# Patient Record
Sex: Female | Born: 1980 | Race: Black or African American | Hispanic: No | Marital: Single | Smoking: Never smoker
Health system: Southern US, Community
[De-identification: ages and names within clinical notes are randomized; demographics above are authoritative.]

## PROBLEM LIST (undated history)

## (undated) DIAGNOSIS — R87629 Unspecified abnormal cytological findings in specimens from vagina: Secondary | ICD-10-CM

## (undated) DIAGNOSIS — I10 Essential (primary) hypertension: Secondary | ICD-10-CM

## (undated) HISTORY — DX: Unspecified abnormal cytological findings in specimens from vagina: R87.629

## (undated) HISTORY — DX: Essential (primary) hypertension: I10

---

## 2015-06-06 ENCOUNTER — Encounter: Payer: Medicaid Other | Admitting: Family Medicine

## 2015-06-13 ENCOUNTER — Ambulatory Visit (INDEPENDENT_AMBULATORY_CARE_PROVIDER_SITE_OTHER): Payer: Medicaid Other | Admitting: Family Medicine

## 2015-06-13 ENCOUNTER — Other Ambulatory Visit (HOSPITAL_COMMUNITY)
Admission: RE | Admit: 2015-06-13 | Discharge: 2015-06-13 | Disposition: A | Discharge status: Home / Self Care | Payer: Medicaid Other | Source: Ambulatory Visit | Attending: Family Medicine | Admitting: Family Medicine

## 2015-06-13 ENCOUNTER — Encounter: Payer: Self-pay | Admitting: Family Medicine

## 2015-06-13 VITALS — Ht 67.0 in | Wt 142.8 lb

## 2015-06-13 DIAGNOSIS — R87612 Low grade squamous intraepithelial lesion on cytologic smear of cervix (LGSIL): Secondary | ICD-10-CM | POA: Diagnosis present

## 2015-06-13 DIAGNOSIS — R87619 Unspecified abnormal cytological findings in specimens from cervix uteri: Secondary | ICD-10-CM | POA: Diagnosis present

## 2015-06-13 LAB — POCT PREGNANCY, URINE: PREG TEST UR: NEGATIVE

## 2015-06-13 NOTE — Progress Notes (Signed)
PAP: LSIL Patient given informed consent, signed copy in the chart, time out was performed.  Placed in lithotomy position. Cervix viewed with speculum and colposcope after application of acetic acid.   Colposcopy adequate?  Yes, TMZ seen 360 Acetowhite lesions?12 oclock, 5 oclock Punctation?course at 5 oclock Mosaicism?  no Abnormal vasculature?  no Biopsies?5, 12 oclock ECC?yes  Patient was given post procedure instructions.

## 2015-06-13 NOTE — Patient Instructions (Signed)

## 2015-06-24 ENCOUNTER — Telehealth: Payer: Self-pay | Admitting: General Practice

## 2015-06-24 NOTE — Telephone Encounter (Signed)
Telephone call to patient regarding colpo results, need for LEEP, and LEEP appt. No answer- left message stating we are trying to reach you with results and in regards to an appt, please call us back at the clinics

## 2015-06-24 NOTE — Telephone Encounter (Signed)
Received message left on nurse line today at 1321.  Patient states she is returning our call.  Needs appointment and test results.  Requests a return call.

## 2015-06-27 NOTE — Telephone Encounter (Addendum)
Attempted to contact pt LM to return call back to the Clinics and that we will be closed for the holiday and reopening on Tuesday.   9/8  1650  Called pt and informed her of Colpo results requiring LEEP procedure. The procedure was briefly explained.  Pt voiced understanding and agreed to LEEP appt on 9/30 @ 0930.  Diane Day RNC

## 2015-07-25 ENCOUNTER — Ambulatory Visit (INDEPENDENT_AMBULATORY_CARE_PROVIDER_SITE_OTHER): Payer: Medicaid Other | Admitting: Family Medicine

## 2015-07-25 ENCOUNTER — Encounter: Payer: Self-pay | Admitting: Family Medicine

## 2015-07-25 ENCOUNTER — Other Ambulatory Visit (HOSPITAL_COMMUNITY)
Admission: RE | Admit: 2015-07-25 | Discharge: 2015-07-25 | Disposition: A | Discharge status: Home / Self Care | Payer: Medicaid Other | Source: Ambulatory Visit | Attending: Family Medicine | Admitting: Family Medicine

## 2015-07-25 VITALS — BP 129/98 | HR 68 | Temp 98.7°F | Wt 145.8 lb

## 2015-07-25 DIAGNOSIS — Z3202 Encounter for pregnancy test, result negative: Secondary | ICD-10-CM | POA: Diagnosis not present

## 2015-07-25 DIAGNOSIS — R87619 Unspecified abnormal cytological findings in specimens from cervix uteri: Secondary | ICD-10-CM | POA: Diagnosis not present

## 2015-07-25 DIAGNOSIS — N871 Moderate cervical dysplasia: Secondary | ICD-10-CM

## 2015-07-25 LAB — POCT PREGNANCY, URINE: PREG TEST UR: NEGATIVE

## 2015-07-25 NOTE — Patient Instructions (Signed)

## 2015-07-25 NOTE — Progress Notes (Signed)
  LEEP PROCEDURE NOTE Pap smear and colposcopy reviewed.   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  Role of HPV,cervical dysplasia and need for close followup was empasized. Informed written consent was obtained. All questions were answered. Time out performed.  Procedure: The patient was placed in lithotomy position and the bivalved coated speculum was placed in the patient's vagina. A grounding pad placed on the patient. Lugol's solution was applied to the cervix and areas of decreased uptake were noted 6, 12 o'clock.   Local anesthesia was administered via an intracervical block using 10cc of 2% Lidocaine with epinephrine. The suction was turned on and the Large 1X Fisher Cone Biopsy Excisor on 50 Watts of cutting current was used to excise the area of decreased uptake and excise the entire transformation zone. Excellent hemostasis was achieved using roller ball coagulation set at 50 Watts coagulation current. Monsel's solution was then applied and the speculum was removed from the vagina. Specimens were sent to pathology. The patient tolerated the procedure well. Post-operative instructions given to patient, including instruction to seek medical attention for persistent bright red bleeding, fever, abdominal/pelvic pain, dysuria, nausea or vomiting. She was also told about the possibility of having copious yellow to black tinged discharge. She was counseled to avoid anything in the vagina (sex/douching/tampons) for 4-6 weeks.

## 2015-08-01 ENCOUNTER — Telehealth: Payer: Self-pay | Admitting: *Deleted

## 2015-08-01 NOTE — Telephone Encounter (Signed)
Per Dr. Adrian Blackwater need to call Erminie and let her know LEEP specimen shows all abnormal tissue removed, recommend cotesting in 12 months.    Called [place] and notified her above- She voices understanding. She plans to get pap with cotesting at Doctor who referred her to [place].  [facility].

## 2015-08-14 ENCOUNTER — Telehealth: Payer: Self-pay

## 2015-08-14 NOTE — Telephone Encounter (Signed)
Pt called and stated that she has a LEEP on 07/25/15 and that now she has been bleeding for 3 weeks.  Is this normal?

## 2015-08-15 NOTE — Telephone Encounter (Signed)
Called April Willis and she reports after her LEEP she had yellowish discharge for about 3 days, then light bleeding for a few days, then heavier bleeding for a few days. Since then states it slowed down then came back, and got light again. States now feels like her period is coming on because it has been a month and is due- c/o cramping, bleeding with small clots like a period, changing pad every 2 hours.  Instructed it LEEP can cause change in cycle and if she has not stopped bleeding by Wednesday next week ( 08/20/15 ) to call us and request an appointment to be seen. If bleeding becomes heavy enough to saturate pad in one hour or less for several hours to go to [facility]. She voices understanding.

## 2020-07-18 ENCOUNTER — Ambulatory Visit
Admission: RE | Admit: 2020-07-18 | Discharge: 2020-07-18 | Disposition: A | Discharge status: Home / Self Care | Payer: Medicaid Other | Source: Ambulatory Visit | Attending: Family | Admitting: Family

## 2020-07-18 ENCOUNTER — Other Ambulatory Visit: Payer: Self-pay | Admitting: Family

## 2020-07-18 ENCOUNTER — Other Ambulatory Visit: Payer: Self-pay

## 2020-07-18 DIAGNOSIS — M25562 Pain in left knee: Secondary | ICD-10-CM

## 2020-08-28 ENCOUNTER — Encounter: Payer: Medicaid Other | Attending: Family | Admitting: Dietician

## 2020-08-28 ENCOUNTER — Other Ambulatory Visit: Payer: Self-pay

## 2020-08-28 ENCOUNTER — Encounter: Payer: Self-pay | Admitting: Dietician

## 2020-08-28 DIAGNOSIS — I10 Essential (primary) hypertension: Secondary | ICD-10-CM | POA: Insufficient documentation

## 2020-08-28 NOTE — Progress Notes (Signed)
Medical Nutrition Therapy   Primary concerns today: high blood pressure  Referral diagnosis: I10- hypertension  Preferred learning style: no preference indicated Learning readiness: ready   NUTRITION ASSESSMENT   Clinical Medical Hx: HTN Medications: see chart  Lifestyle & Dietary Hx Patient works at [employer] May eat out sometimes, such as [employer] salad, chicken sandwich, [employer] tuna sandwich. At home will cook with "complete seasoning" and likes to make dishes such as spaghetti made with ground [place]. May snack on Doritos, Cheetos, tuna with crackers, bananas, and cookies. Usually skips breakfast, typical meal pattern is 2-3 meals per day plus snacks.   24-Hr Dietary Recall First Meal: bacon egg & cheese biscuit (or scrambled eggs) Snack: Starkist tuna + wheat crackers Second Meal: sandwich on wheat bread with [place], swiss cheese, and honey mustard Snack: oatmeal raisin cookies Third Meal: beef with rice Snack: - Beverages: green tea w/ sweetener & creamer, water   NUTRITION DIAGNOSIS  Food and nutrition related knowledge deficit (NB-1.1) related to no prior nutrition education as evidenced by referral for hypertension.    NUTRITION INTERVENTION  Nutrition education (E-1) on the following topics:  . Hypertension and nutritional strategies for helping to lower blood pressure  Handouts Provided Include   Hypertension Nutrition Therapy   Learning Style & Readiness for Change Teaching method utilized: Visual & Auditory  Demonstrated degree of understanding via: Teach Back  Barriers to learning/adherence to lifestyle change: None Identified    MONITORING & EVALUATION Dietary intake, weekly physical activity, and goals PRN.  Next Steps  Patient is to contact [facility] as needed.

## 2020-11-01 ENCOUNTER — Other Ambulatory Visit: Payer: Self-pay

## 2020-11-01 ENCOUNTER — Other Ambulatory Visit: Payer: Medicaid Other

## 2020-11-01 DIAGNOSIS — Z20822 Contact with and (suspected) exposure to covid-19: Secondary | ICD-10-CM

## 2020-11-03 LAB — SARS-COV-2, NAA 2 DAY TAT

## 2020-11-03 LAB — NOVEL CORONAVIRUS, NAA: SARS-CoV-2, NAA: DETECTED — AB

## 2020-11-04 ENCOUNTER — Ambulatory Visit: Payer: Self-pay | Admitting: *Deleted

## 2020-11-04 NOTE — Telephone Encounter (Signed)
Pt called in with questions regarding her positive covid test.   The quarantine instructions, etc were sent to her yesterday via MyChart from a nurse.   I answered her questions regarding the instructions. She thanked me for my help with clarification.    Reason for Disposition . [1] [id number] diagnosed by positive lab test (e.g., PCR, rapid self-test kit) AND [2] mild symptoms (e.g., cough, fever, others) AND [3] no complications or SOB  Answer Assessment - Initial Assessment Questions 1. COVID-19 DIAGNOSIS: "Who made your COVID-19 diagnosis?" "Was it confirmed by a positive lab test?" If not diagnosed by a HCP, ask "Are there lots of cases (community spread) where you live?" Note: See public health department website, if unsure.     Yes  2. COVID-19 EXPOSURE: "Was there any known exposure to COVID before the symptoms began?" CDC Definition of close contact: within 6 feet (2 meters) for a total of 15 minutes or more over a 24-hour period.      *No Answer* 3. ONSET: "When did the COVID-19 symptoms start?"      Last Wed. 4. WORST SYMPTOM: "What is your worst symptom?" (e.g., cough, fever, shortness of breath, muscle aches)     No 5. COUGH: "Do you have a cough?" If Yes, ask: "How bad is the cough?"       No 6. FEVER: "Do you have a fever?" If Yes, ask: "What is your temperature, how was it measured, and when did it start?"     *No Answer* 7. RESPIRATORY STATUS: "Describe your breathing?" (e.g., shortness of breath, wheezing, unable to speak)      *No Answer* 8. BETTER-SAME-WORSE: "Are you getting better, staying the same or getting worse compared to yesterday?"  If getting worse, ask, "In what way?"     *No Answer* 9. HIGH RISK DISEASE: "Do you have any chronic medical problems?" (e.g., asthma, heart or lung disease, weak immune system, obesity, etc.)     *No Answer* 10. VACCINE: "Have you gotten the COVID-19 vaccine?" If Yes ask: "Which one, how many shots, when did you get it?"       *No  Answer* 11. PREGNANCY: "Is there any chance you are pregnant?" "When was your last menstrual period?"       *No Answer* 12. OTHER SYMPTOMS: "Do you have any other symptoms?"  (e.g., chills, fatigue, headache, loss of smell or taste, muscle pain, sore throat; new loss of smell or taste especially support the diagnosis of COVID-19)       *No Answer*  Protocols used: CORONAVIRUS (COVID-19) DIAGNOSED OR SUSPECTED-A-AH

## 2022-04-04 ENCOUNTER — Ambulatory Visit
Admission: EM | Admit: 2022-04-04 | Discharge: 2022-04-04 | Disposition: A | Discharge status: Home / Self Care | Payer: Medicaid Other | Attending: Urgent Care | Admitting: Urgent Care

## 2022-04-04 ENCOUNTER — Ambulatory Visit (INDEPENDENT_AMBULATORY_CARE_PROVIDER_SITE_OTHER): Payer: Medicaid Other

## 2022-04-04 DIAGNOSIS — M62830 Muscle spasm of back: Secondary | ICD-10-CM | POA: Diagnosis not present

## 2022-04-04 DIAGNOSIS — M545 Low back pain, unspecified: Secondary | ICD-10-CM

## 2022-04-04 DIAGNOSIS — M4126 Other idiopathic scoliosis, lumbar region: Secondary | ICD-10-CM

## 2022-04-04 DIAGNOSIS — I10 Essential (primary) hypertension: Secondary | ICD-10-CM

## 2022-04-04 MED ORDER — TRIAMCINOLONE ACETONIDE 40 MG/ML IJ SUSP
40.0000 mg | Freq: Once | INTRAMUSCULAR | Status: AC
  Administered 2022-04-04: 40 mg via INTRAMUSCULAR

## 2022-04-04 MED ORDER — TIZANIDINE HCL 4 MG PO TABS
4.0000 mg | ORAL_TABLET | Freq: Every day | ORAL | 0 refills | Status: AC
Start: 2022-04-04 — End: ?

## 2022-04-04 NOTE — ED Triage Notes (Signed)
Pt states about a month ago at work she was lifting something heavy and is now having back pain (ache) that has progressively gotten worse. She states if she moves too fast it feels like her left knee is going to give up.  Home interventions: Aleve - hot helpful

## 2022-04-04 NOTE — Discharge Instructions (Addendum)
We have done a steroid injection today for your severe back pain. Stop using Aleve. You can use Tylenol at a dose of 500-650mg  every 6 hours as needed. Use tizanidine at bedtime. If your pain persists, seek a consultation with a spine specialist listed on the first page.   For diabetes or elevated blood sugar, please make sure you are limiting and avoiding starchy, carbohydrate foods like pasta, breads, sweet breads, pastry, rice, potatoes, desserts. These foods can elevate your blood sugar. Also, limit and avoid drinks that contain a lot of sugar such as sodas, sweet teas, fruit juices.  Drinking plain water will be much more helpful, try 64-80 ounces of water daily. This can definitely help you with your back pain as well. It is okay to flavor your water naturally by cutting cucumber, lemon, mint or lime, placing it in a picture with water and drinking it over a period of 24-48 hours as long as it remains refrigerated.  For elevated blood pressure, make sure you are monitoring salt in your diet.  Do not eat restaurant foods and limit processed foods at home. I highly recommend you prepare and cook your own foods at home.  Processed foods include things like frozen meals, pre-seasoned meats and dinners, deli meats, canned foods as these foods contain a high amount of sodium/salt.  Make sure you are paying attention to sodium labels on foods you buy at the grocery store. Buy your spices separately such as garlic powder, onion powder, cumin, cayenne, parsley flakes so that you can avoid seasonings that contain salt. However, salt-free seasonings are available and can be used, an example is Mrs. Dash and includes a lot of different mixtures that do not contain salt.  Lastly, when cooking using oils that are healthier for you is important. This includes olive oil, avocado oil, canola oil. We have discussed a lot of foods to avoid but below is a list of foods that can be very healthy to use in your diet whether it  is for diabetes, cholesterol, high blood pressure, or in general healthy eating.  Salads - kale, spinach, cabbage, spring mix, arugula Fruits - avocadoes, berries (blueberries, raspberries, blackberries), apples, oranges, pomegranate, grapefruit, kiwi Vegetables - asparagus, cauliflower, broccoli, green beans, brussel sprouts, bell peppers, beets; stay away from or limit starchy vegetables like potatoes, carrots, peas Other general foods - kidney beans, egg whites, almonds, walnuts, sunflower seeds, pumpkin seeds, fat free yogurt, almond milk, flax seeds, quinoa, oats  Meat - It is better to eat lean meats and limit your red meat including pork to once a week.  Wild caught fish, chicken breast are good options as they tend to be leaner sources of good protein. Still be mindful of the sodium labels for the meats you buy.  DO NOT EAT ANY FOODS ON THIS LIST THAT YOU ARE ALLERGIC TO. For more specific needs, I highly recommend consulting a dietician or nutritionist but this can definitely be a good starting point.

## 2022-04-04 NOTE — ED Provider Notes (Addendum)
[facility]   MRN: [id number] DOB: 30-Jan-1981  Subjective:   April Willis is a 41 y.o. female presenting for 1 month history of progressively worsening low back pain, stiffness.  Symptoms started when she tried to lift a heavy item at work.  States that she had to try multiple times and felt like she strained herself.  The back pain has only gotten worse.  She is used Aleve consistently but is not helping at all.  Regarding her blood pressure, did not take her medications today.  Has follow-up with her PCP in about a week.  No weakness, numbness or tingling, fall, trauma, changes to bowel or urinary habits, saddle paresthesia.  No current facility-administered medications for this encounter. No current outpatient medications on file.   No Known Allergies  Past Medical History:  Diagnosis Date   Hypertension    Vaginal Pap smear, abnormal      History reviewed. No pertinent surgical history.  Family History  Problem Relation Age of Onset   Hypertension Father    Hypertension Mother    Ovarian cancer Mother     Social History   Tobacco Use   Smoking status: Never   Smokeless tobacco: Never  Substance Use Topics   Alcohol use: Yes   Drug use: No    ROS   Objective:   Vitals: BP (!) 144/102 (BP Location: Right Arm) Comment: pt states she has not taken her BP med today, provider aware  Pulse 82   Temp 98.7 F (37.1 C) (Oral)   Resp 18   LMP 03/15/2022   SpO2 97%   BP Readings from Last 3 Encounters:  04/04/22 (!) 144/102  07/25/15 (!) 129/98   Physical Exam Constitutional:      General: She is not in acute distress.    Appearance: Normal appearance. She is well-developed. She is not ill-appearing, toxic-appearing or diaphoretic.  HENT:     Head: Normocephalic and atraumatic.     Nose: Nose normal.     Mouth/Throat:     Mouth: Mucous membranes are moist.  Eyes:     General: No scleral icterus.       Right eye: No discharge.         Left eye: No discharge.     Extraocular Movements: Extraocular movements intact.  Cardiovascular:     Rate and Rhythm: Normal rate.  Pulmonary:     Effort: Pulmonary effort is normal.  Musculoskeletal:     Lumbar back: Spasms and tenderness (lower midline) present. No swelling, edema, deformity, signs of trauma, lacerations or bony tenderness. Normal range of motion. Negative right straight leg raise test and negative left straight leg raise test. No scoliosis.     Comments: Full range of motion throughout.  Strength 5/5 for lower extremities.  Patient ambulates without any assistance at expected pace.  No ecchymosis, swelling, lacerations or abrasions.    Skin:    General: Skin is warm and dry.  Neurological:     General: No focal deficit present.     Mental Status: She is alert and oriented to person, place, and time.  Psychiatric:        Mood and Affect: Mood normal.        Behavior: Behavior normal.    DG Lumbar Spine Complete  Result Date: 04/04/2022 CLINICAL DATA:  Low back pain EXAM: LUMBAR SPINE - COMPLETE 4+ VIEW COMPARISON:  None Available. FINDINGS: Mild curvature of the lumbar spine is convex towards the  left. Alignment is otherwise unremarkable. The vertebral body heights and disc spaces are well preserved. No significant degenerative change identified. No signs of acute fracture or dislocation. IMPRESSION: 1. No acute findings. 2. Mild scoliosis. Electronically Signed   By: Signa Kell M.D.   On: 04/04/2022 09:07    IM triamcinolone acetamide at a dose of 40 mg was injected.  Assessment and Plan :   PDMP not reviewed this encounter.  1. Acute low back pain, unspecified back pain laterality, unspecified whether sciatica present   2. Essential hypertension   3. Spasm of muscle of lower back    Given the severity of her pain, physical exam findings recommended steroids and the patient opted for an injection.  Use a muscle relaxant, hydrate well.  Emphasized need for  compliance with her hypertensive medications.  Follow-up with the neurosurgery clinic if symptoms persist. Counseled patient on potential for adverse effects with medications prescribed/recommended today, ER and return-to-clinic precautions discussed, patient verbalized understanding.     Wallis Bamberg, [place] 04/04/22 [phone]

## 2023-11-17 IMAGING — DX DG LUMBAR SPINE COMPLETE 4+V
5 series · 5 of 5 positions shown · non-contrast
Comparison: None Available.

CLINICAL DATA: Low back pain

EXAM:
LUMBAR SPINE - COMPLETE 4+ VIEW

[lumbar spine ap]
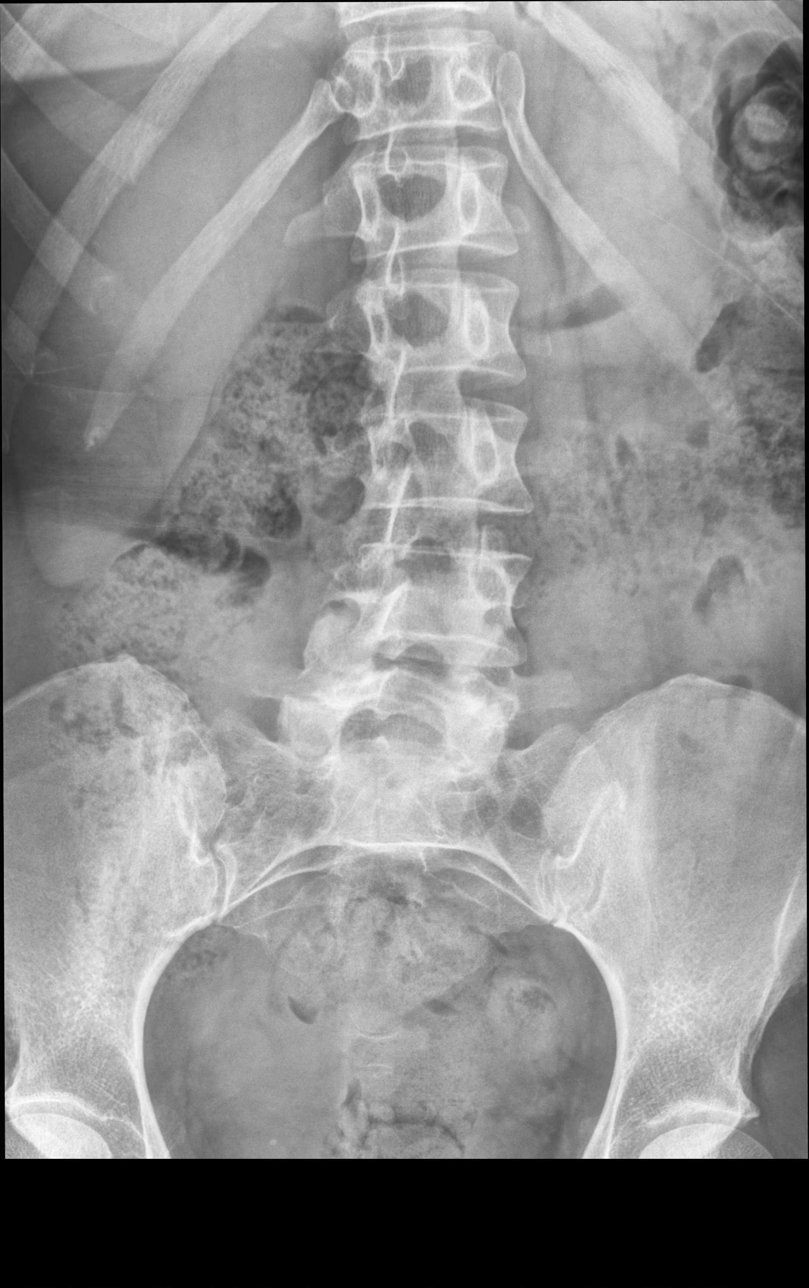

[lumbar spine rpo]
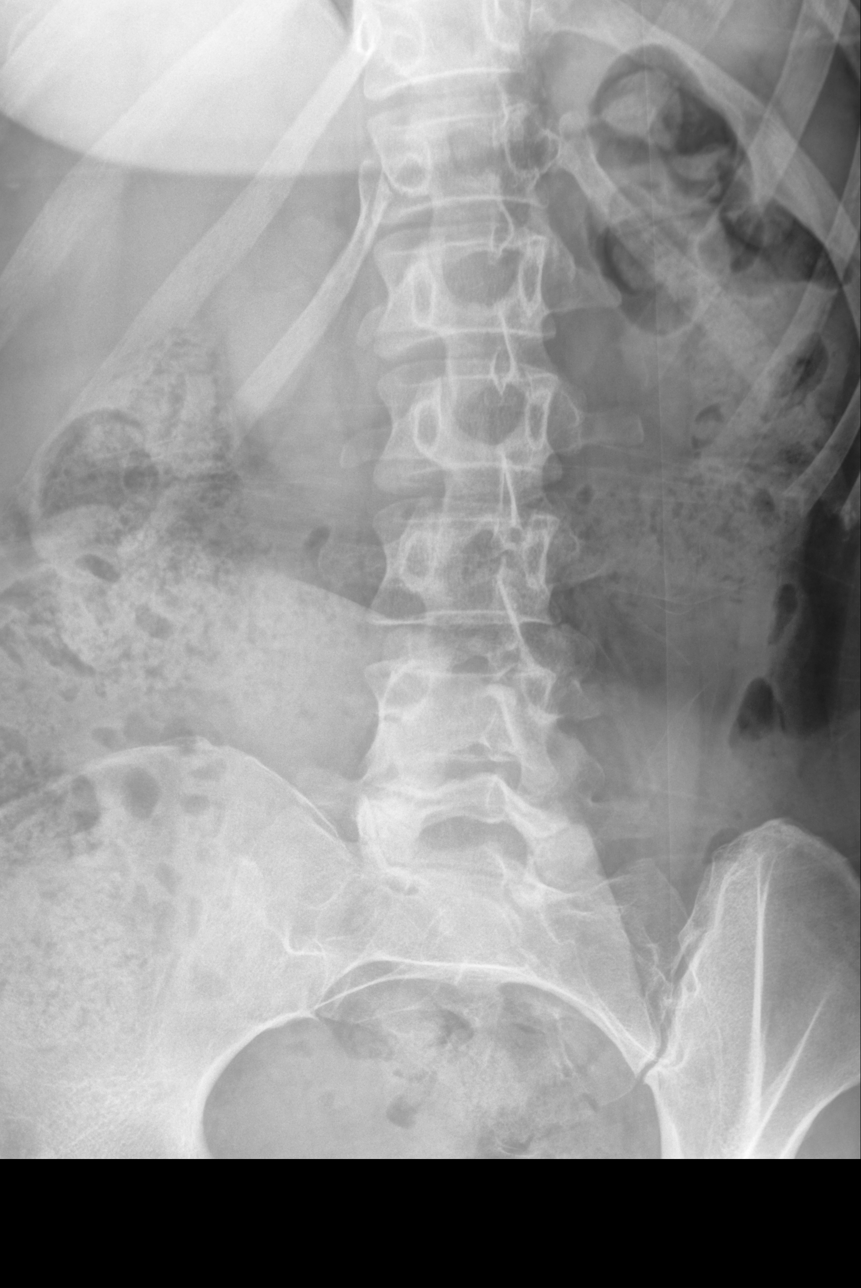

[lumbar spine lpo]
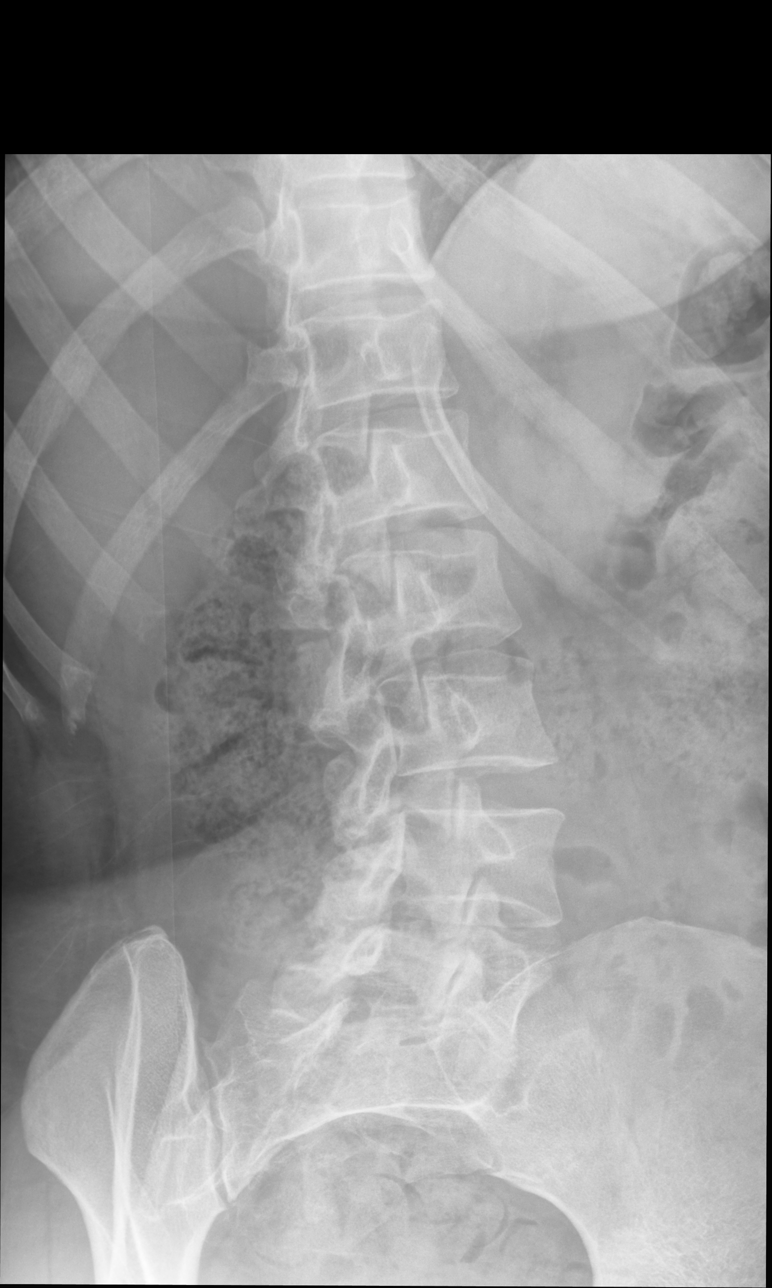

[lumbar spine lat]
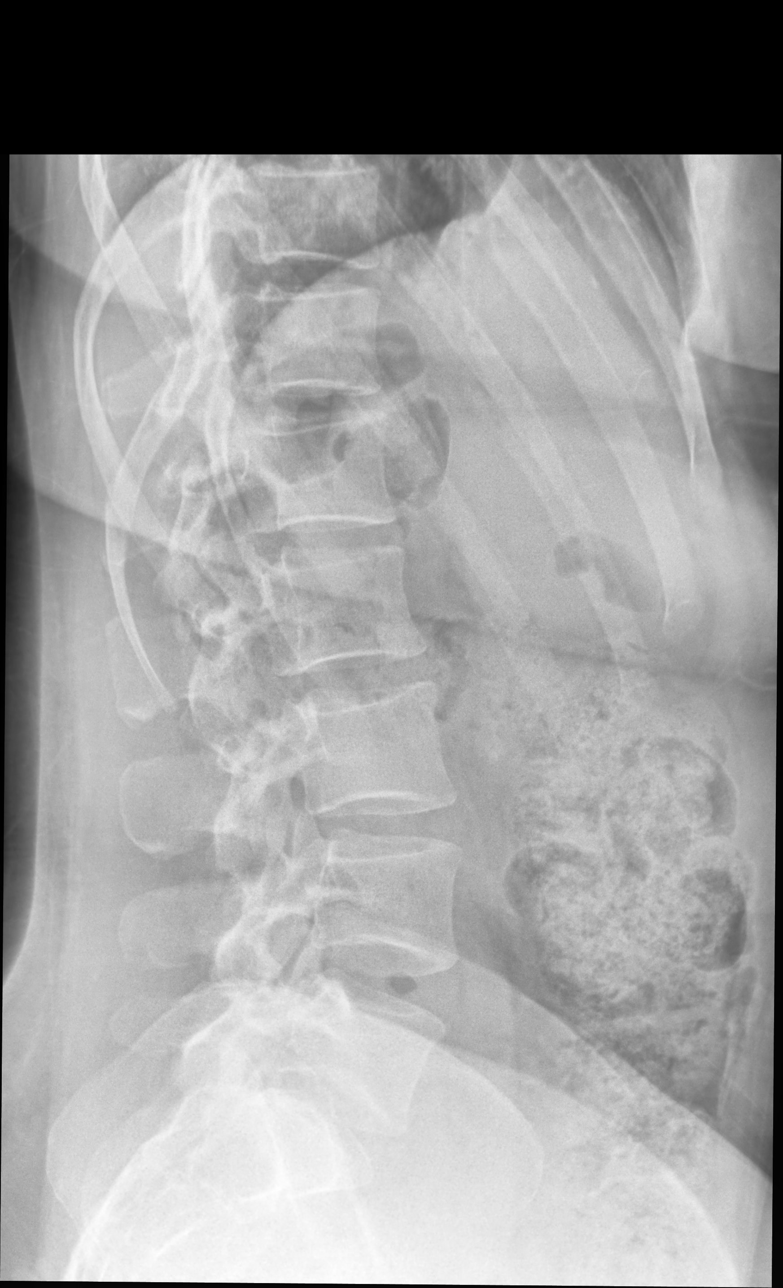

[lumbar spine l5-s1]
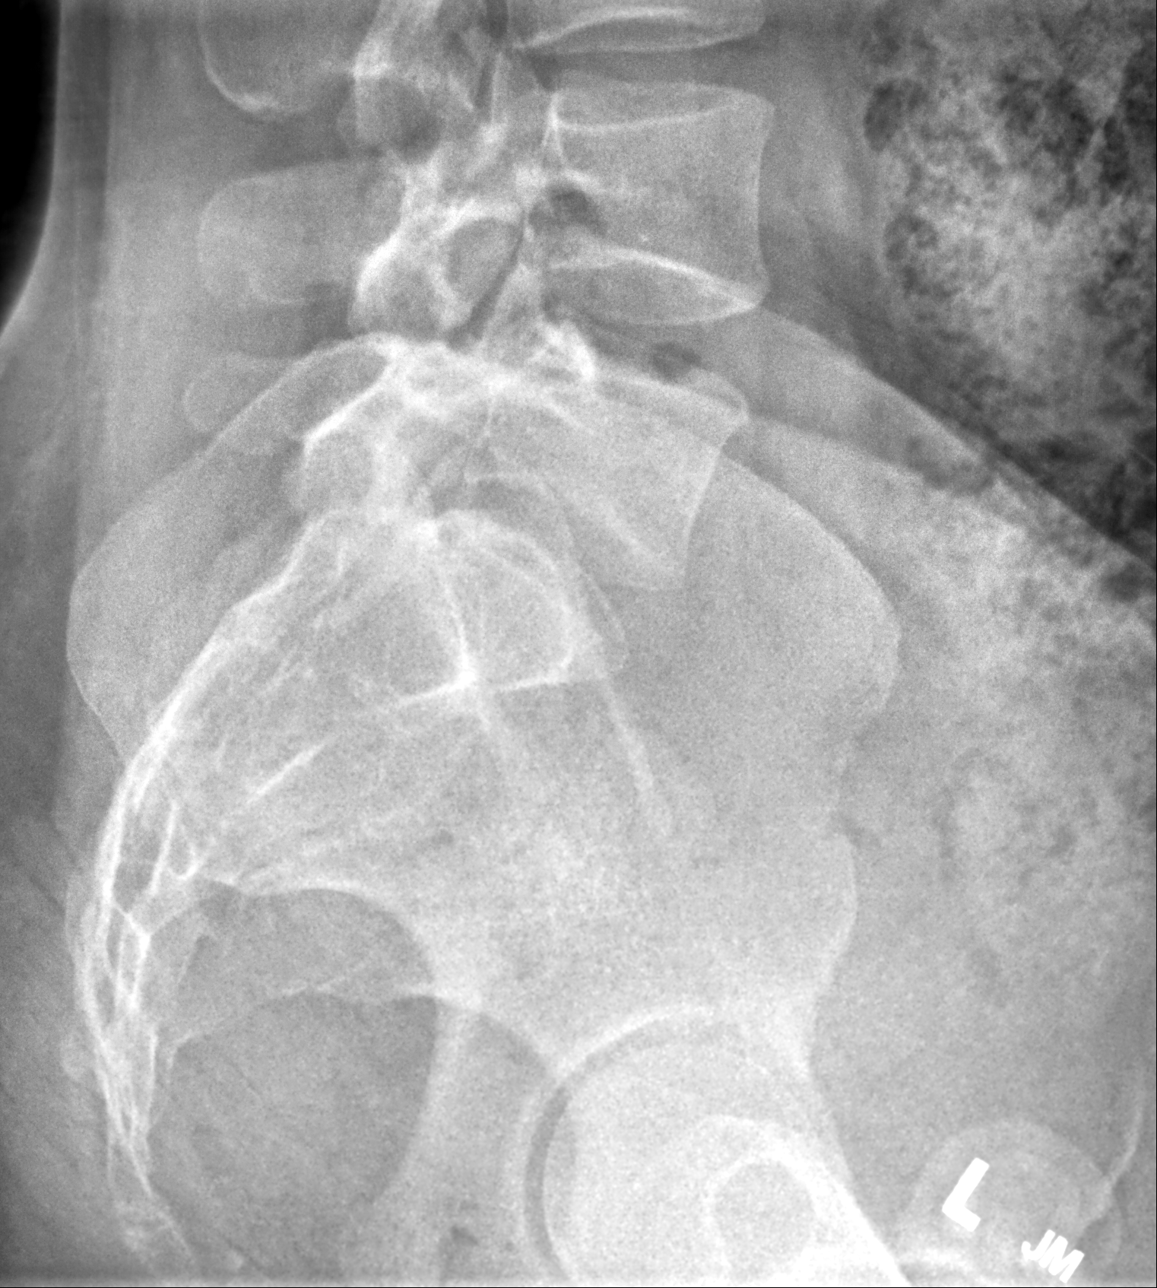

[5 of 5 positions shown; findings below may reference images not displayed]

FINDINGS: Mild curvature of the lumbar spine is convex towards the left.
Alignment is otherwise unremarkable. The vertebral body heights and
disc spaces are well preserved. No significant degenerative change
identified. No signs of acute fracture or dislocation.
IMPRESSION: 1. No acute findings.
2. Mild scoliosis.

## 2024-08-02 ENCOUNTER — Other Ambulatory Visit: Payer: Self-pay

## 2024-08-02 ENCOUNTER — Emergency Department (HOSPITAL_COMMUNITY)
Admission: EM | Admit: 2024-08-02 | Discharge: 2024-08-03 | Disposition: A | Discharge status: Home / Self Care | Attending: Emergency Medicine | Admitting: Emergency Medicine

## 2024-08-02 DIAGNOSIS — K529 Noninfective gastroenteritis and colitis, unspecified: Secondary | ICD-10-CM | POA: Insufficient documentation

## 2024-08-02 DIAGNOSIS — R1013 Epigastric pain: Secondary | ICD-10-CM | POA: Diagnosis present

## 2024-08-02 DIAGNOSIS — D72829 Elevated white blood cell count, unspecified: Secondary | ICD-10-CM | POA: Insufficient documentation

## 2024-08-02 LAB — COMPREHENSIVE METABOLIC PANEL WITH GFR
ALT: 6 U/L (ref 0–44)
AST: 13 U/L — ABNORMAL LOW (ref 15–41)
Albumin: 4.2 g/dL (ref 3.5–5.0)
Alkaline Phosphatase: 72 U/L (ref 38–126)
Anion gap: 11 (ref 5–15)
BUN: 9 mg/dL (ref 6–20)
CO2: 24 mmol/L (ref 22–32)
Calcium: 9.1 mg/dL (ref 8.9–10.3)
Chloride: 104 mmol/L (ref 98–111)
Creatinine, Ser: 0.78 mg/dL (ref 0.44–1.00)
GFR, Estimated: >60 mL/min (ref >60)
Glucose, Bld: 98 mg/dL (ref 70–99)
Potassium: 3.4 mmol/L — ABNORMAL LOW (ref 3.5–5.1)
Sodium: 138 mmol/L (ref 135–145)
Total Bilirubin: 0.8 mg/dL (ref 0.0–1.2)
Total Protein: 6.9 g/dL (ref 6.5–8.1)

## 2024-08-02 LAB — URINALYSIS, ROUTINE W REFLEX MICROSCOPIC
Bilirubin Urine: NEGATIVE
Glucose, UA: NEGATIVE mg/dL
Ketones, ur: NEGATIVE mg/dL
Nitrite: NEGATIVE
Protein, ur: 100 mg/dL — AB
RBC / HPF: >50 RBC/hpf (ref 0–5)
Specific Gravity, Urine: 1.017 (ref 1.005–1.030)
pH: 6 (ref 5.0–8.0)

## 2024-08-02 LAB — CBC
HCT: 37.6 % (ref 36.0–46.0)
Hemoglobin: 11.2 g/dL — ABNORMAL LOW (ref 12.0–15.0)
MCH: 25.4 pg — ABNORMAL LOW (ref 26.0–34.0)
MCHC: 29.8 g/dL — ABNORMAL LOW (ref 30.0–36.0)
MCV: 85.3 fL (ref 80.0–100.0)
Platelets: 446 K/uL — ABNORMAL HIGH (ref 150–400)
RBC: 4.41 MIL/uL (ref 3.87–5.11)
RDW: 13.5 % (ref 11.5–15.5)
WBC: 12.4 K/uL — ABNORMAL HIGH (ref 4.0–10.5)
nRBC: 0 % (ref 0.0–0.2)

## 2024-08-02 LAB — HCG, SERUM, QUALITATIVE: Preg, Serum: NEGATIVE

## 2024-08-02 LAB — LIPASE, BLOOD: Lipase: 19 U/L (ref 11–51)

## 2024-08-02 MED ORDER — ALUM & MAG HYDROXIDE-SIMETH 200-200-20 MG/5ML PO SUSP
30.0000 mL | Freq: Once | ORAL | Status: AC
  Administered 2024-08-02: 30 mL via ORAL
  Filled 2024-08-02: qty 30

## 2024-08-02 NOTE — ED Triage Notes (Signed)
 Patient c/o epigastric pain x 1 day. Patient report worsening pain after eating crackers tonight. Patient stated she tried TUMS with no relief. Patient report nausea and vomiting x 2. Patient denies chest pain and SOB. Patient denies dysuria.

## 2024-08-03 ENCOUNTER — Emergency Department (HOSPITAL_COMMUNITY)

## 2024-08-03 ENCOUNTER — Encounter (HOSPITAL_COMMUNITY): Payer: Self-pay

## 2024-08-03 MED ORDER — ONDANSETRON HCL 4 MG/2ML IJ SOLN
4.0000 mg | Freq: Once | INTRAMUSCULAR | Status: AC
  Administered 2024-08-03: 4 mg via INTRAVENOUS
  Filled 2024-08-03: qty 2

## 2024-08-03 MED ORDER — IOHEXOL 300 MG/ML  SOLN
100.0000 mL | Freq: Once | INTRAMUSCULAR | Status: AC | PRN
  Administered 2024-08-03: 100 mL via INTRAVENOUS

## 2024-08-03 MED ORDER — FAMOTIDINE 20 MG PO TABS: 20.0000 mg | ORAL_TABLET | Freq: Two times a day (BID) | ORAL | 0 refills | Status: AC

## 2024-08-03 MED ORDER — ONDANSETRON 8 MG PO TBDP: ORAL_TABLET | ORAL | 0 refills | Status: AC

## 2024-08-03 MED ORDER — KETOROLAC TROMETHAMINE 30 MG/ML IJ SOLN
30.0000 mg | Freq: Once | INTRAMUSCULAR | Status: AC
Start: 2024-08-03 — End: 2024-08-03
  Administered 2024-08-03: 30 mg via INTRAVENOUS
  Filled 2024-08-03: qty 1

## 2024-08-03 NOTE — ED Provider Notes (Signed)
 [place] EMERGENCY DEPARTMENT AT [facility] Provider Note   CSN: [id number] Arrival date & time: 08/02/24  1940     Patient presents with: Abdominal Pain   April Willis is a 43 y.o. female.   The history is provided by the patient.  Abdominal Pain Pain location:  Epigastric Pain quality: cramping   Pain radiates to:  Does not radiate Pain severity:  Severe Duration:  1 day Progression:  Waxing and waning Chronicity:  New Relieved by:  Nothing Worsened by:  Nothing Ineffective treatments:  None tried Associated symptoms: no anorexia and no fever   Risk factors: no alcohol abuse        Prior to Admission medications   Medication Sig Start Date End Date Taking? Authorizing Provider  famotidine (PEPCID) 20 MG tablet Take 1 tablet (20 mg total) by mouth 2 (two) times daily. 08/03/24  Yes Briggette Najarian, MD  ondansetron (ZOFRAN-ODT) 8 MG disintegrating tablet 8mg  ODT q8hours prn nausea 08/03/24  Yes Elgene Coral, MD  tiZANidine  (ZANAFLEX ) 4 MG tablet Take 1 tablet (4 mg total) by mouth at bedtime. 04/04/22   Christopher Savannah, PA-C    Allergies: Patient has no known allergies.    Review of Systems  Constitutional:  Negative for fever.  Respiratory:  Negative for wheezing and stridor.   Gastrointestinal:  Positive for abdominal pain. Negative for anorexia.  All other systems reviewed and are negative.   Updated Vital Signs BP (!) 140/94   Pulse 66   Temp 97.9 F (36.6 C) (Oral)   Resp 14   LMP 08/02/2024 Comment: negative HCG 08/02/24  SpO2 100%   Physical Exam Vitals and nursing note reviewed.  Constitutional:      General: She is not in acute distress.    Appearance: She is well-developed.  HENT:     Head: Normocephalic and atraumatic.     Nose: Nose normal.  Eyes:     Pupils: Pupils are equal, round, and reactive to light.  Cardiovascular:     Rate and Rhythm: Normal rate and regular rhythm.     Pulses: Normal pulses.     Heart sounds: Normal  heart sounds.  Pulmonary:     Effort: Pulmonary effort is normal. No respiratory distress.     Breath sounds: Normal breath sounds.  Abdominal:     General: Bowel sounds are normal. There is no distension.     Palpations: Abdomen is soft.     Tenderness: There is no abdominal tenderness. There is no guarding or rebound.  Genitourinary:    Vagina: No vaginal discharge.  Musculoskeletal:        General: Normal range of motion.     Cervical back: Normal range of motion and neck supple.  Skin:    General: Skin is warm and dry.     Capillary Refill: Capillary refill takes less than 2 seconds.     Findings: No erythema or rash.  Neurological:     General: No focal deficit present.     Mental Status: She is oriented to person, place, and time.     Deep Tendon Reflexes: Reflexes normal.  Psychiatric:        Mood and Affect: Mood normal.     (all labs ordered are listed, but only abnormal results are displayed) Results for orders placed or performed during the hospital encounter of 08/02/24  Lipase, blood   Collection Time: 08/02/24  8:11 PM  Result Value Ref Range   Lipase 19 11 -  51 U/L  Comprehensive metabolic panel   Collection Time: 08/02/24  8:11 PM  Result Value Ref Range   Sodium 138 135 - 145 mmol/L   Potassium 3.4 (L) 3.5 - 5.1 mmol/L   Chloride 104 98 - 111 mmol/L   CO2 24 22 - 32 mmol/L   Glucose, Bld 98 70 - 99 mg/dL   BUN 9 6 - 20 mg/dL   Creatinine, Ser 9.21 0.44 - 1.00 mg/dL   Calcium 9.1 8.9 - 89.6 mg/dL   Total Protein 6.9 6.5 - 8.1 g/dL   Albumin 4.2 3.5 - 5.0 g/dL   AST 13 (L) 15 - 41 U/L   ALT 6 0 - 44 U/L   Alkaline Phosphatase 72 38 - 126 U/L   Total Bilirubin 0.8 0.0 - 1.2 mg/dL   GFR, Estimated >39 >39 mL/min   Anion gap 11 5 - 15  CBC   Collection Time: 08/02/24  8:11 PM  Result Value Ref Range   WBC 12.4 (H) 4.0 - 10.5 K/uL   RBC 4.41 3.87 - 5.11 MIL/uL   Hemoglobin 11.2 (L) 12.0 - 15.0 g/dL   HCT 62.3 63.9 - 53.9 %   MCV 85.3 80.0 - 100.0  fL   MCH 25.4 (L) 26.0 - 34.0 pg   MCHC 29.8 (L) 30.0 - 36.0 g/dL   RDW 86.4 88.4 - 84.4 %   Platelets 446 (H) 150 - 400 K/uL   nRBC 0.0 0.0 - 0.2 %  hCG, serum, qualitative   Collection Time: 08/02/24  8:11 PM  Result Value Ref Range   Preg, Serum NEGATIVE NEGATIVE  Urinalysis, Routine w reflex microscopic -Urine, Clean Catch   Collection Time: 08/02/24  9:55 PM  Result Value Ref Range   Color, Urine RED (A) YELLOW   APPearance CLOUDY (A) CLEAR   Specific Gravity, Urine 1.017 1.005 - 1.030   pH 6.0 5.0 - 8.0   Glucose, UA NEGATIVE NEGATIVE mg/dL   Hgb urine dipstick LARGE (A) NEGATIVE   Bilirubin Urine NEGATIVE NEGATIVE   Ketones, ur NEGATIVE NEGATIVE mg/dL   Protein, ur 899 (A) NEGATIVE mg/dL   Nitrite NEGATIVE NEGATIVE   Leukocytes,Ua TRACE (A) NEGATIVE   RBC / HPF >50 0 - 5 RBC/hpf   WBC, UA 6-10 0 - 5 WBC/hpf   Bacteria, UA RARE (A) NONE SEEN   Squamous Epithelial / HPF 0-5 0 - 5 /HPF   Mucus PRESENT    CT ABDOMEN PELVIS W CONTRAST Result Date: 08/03/2024 CLINICAL DATA:  Epigastric pain for 1 day, initial encounter EXAM: CT ABDOMEN AND PELVIS WITH CONTRAST TECHNIQUE: Multidetector CT imaging of the abdomen and pelvis was performed using the standard protocol following bolus administration of intravenous contrast. RADIATION DOSE REDUCTION: This exam was performed according to the departmental dose-optimization program which includes automated exposure control, adjustment of the mA and/or kV according to patient size and/or use of iterative reconstruction technique. CONTRAST:  100mL OMNIPAQUE IOHEXOL 300 MG/ML  SOLN COMPARISON:  None Available. FINDINGS: Lower chest: No acute abnormality. Hepatobiliary: No focal liver abnormality is seen. No gallstones, gallbladder wall thickening, or biliary dilatation. Pancreas: Unremarkable. No pancreatic ductal dilatation or surrounding inflammatory changes. Spleen: Normal in size without focal abnormality. Adrenals/Urinary Tract: Adrenal  glands are within normal limits. Kidneys demonstrate a normal enhancement pattern. No renal calculi or obstructive changes are seen. The bladder is within normal limits. Stomach/Bowel: No obstructive or inflammatory changes of the colon are seen. The appendix is within normal limits. Stomach is unremarkable.  No small bowel obstructive changes are seen. A few loops of small bowel with edema within the wall are seen. This may represent some mild enteritis. Vascular/Lymphatic: No significant vascular findings are present. No enlarged abdominal or pelvic lymph nodes. Reproductive: Uterus is enlarged with multiple fibroids identified within. Other: No abdominal wall hernia or abnormality. No abdominopelvic ascites. Musculoskeletal: No acute or significant osseous findings. IMPRESSION: Mildly edematous loops of small bowel without obstructive change. This may represent some mild enteritis. Electronically Signed   By: Oneil Devonshire M.D.   On: 08/03/2024 01:10    Radiology: CT ABDOMEN PELVIS W CONTRAST Result Date: 08/03/2024 CLINICAL DATA:  Epigastric pain for 1 day, initial encounter EXAM: CT ABDOMEN AND PELVIS WITH CONTRAST TECHNIQUE: Multidetector CT imaging of the abdomen and pelvis was performed using the standard protocol following bolus administration of intravenous contrast. RADIATION DOSE REDUCTION: This exam was performed according to the departmental dose-optimization program which includes automated exposure control, adjustment of the mA and/or kV according to patient size and/or use of iterative reconstruction technique. CONTRAST:  100mL OMNIPAQUE IOHEXOL 300 MG/ML  SOLN COMPARISON:  None Available. FINDINGS: Lower chest: No acute abnormality. Hepatobiliary: No focal liver abnormality is seen. No gallstones, gallbladder wall thickening, or biliary dilatation. Pancreas: Unremarkable. No pancreatic ductal dilatation or surrounding inflammatory changes. Spleen: Normal in size without focal abnormality.  Adrenals/Urinary Tract: Adrenal glands are within normal limits. Kidneys demonstrate a normal enhancement pattern. No renal calculi or obstructive changes are seen. The bladder is within normal limits. Stomach/Bowel: No obstructive or inflammatory changes of the colon are seen. The appendix is within normal limits. Stomach is unremarkable. No small bowel obstructive changes are seen. A few loops of small bowel with edema within the wall are seen. This may represent some mild enteritis. Vascular/Lymphatic: No significant vascular findings are present. No enlarged abdominal or pelvic lymph nodes. Reproductive: Uterus is enlarged with multiple fibroids identified within. Other: No abdominal wall hernia or abnormality. No abdominopelvic ascites. Musculoskeletal: No acute or significant osseous findings. IMPRESSION: Mildly edematous loops of small bowel without obstructive change. This may represent some mild enteritis. Electronically Signed   By: Oneil Devonshire M.D.   On: 08/03/2024 01:10     Procedures   Medications Ordered in the ED  alum & mag hydroxide-simeth (MAALOX/MYLANTA) 200-200-20 MG/5ML suspension 30 mL (30 mLs Oral Given 08/02/24 2322)  iohexol (OMNIPAQUE) 300 MG/ML solution 100 mL (100 mLs Intravenous Contrast Given 08/03/24 0052)  ondansetron (ZOFRAN) injection 4 mg (4 mg Intravenous Given 08/03/24 0121)  ketorolac (TORADOL) 30 MG/ML injection 30 mg (30 mg Intravenous Given 08/03/24 0121)                                    Medical Decision Making Patient with epigastric pain and nausea vomiting and diarrhea   Amount and/or Complexity of Data Reviewed External Data Reviewed: notes.    Details: Previous reviewed  Labs: ordered.    Details: Normal lipase 19, negative pregnancy, no uti. Normal sodium 138, potassium slight low 3.4, normal creatinine. White count slight elevation 12.4  Radiology: ordered and independent interpretation performed.    Details: No SBO  Risk OTC  drugs. Prescription drug management. Risk Details: Well appearing. Informed of bland diet.  Tolerated PO in the ED.  Stable for discharge.       Final diagnoses:  Enteritis   No signs of systemic illness or infection. The patient is  nontoxic-appearing on exam and vital signs are within normal limits.  I have reviewed the triage vital signs and the nursing notes. Pertinent labs & imaging results that were available during my care of the patient were reviewed by me and considered in my medical decision making (see chart for details). After history, exam, and medical workup I feel the patient has been appropriately medically screened and is safe for discharge home. Pertinent diagnoses were discussed with the patient. Patient was given return precautions.    ED Discharge Orders          Ordered    ondansetron (ZOFRAN-ODT) 8 MG disintegrating tablet        08/03/24 0125    famotidine (PEPCID) 20 MG tablet  2 times daily        08/03/24 0125               Aftin Lye, MD 08/03/24 0131

## 2024-12-04 ENCOUNTER — Encounter: Payer: Self-pay | Admitting: Obstetrics and Gynecology
